# Patient Record
Sex: Female | Born: 1955 | Race: White | Hispanic: No | Smoking: Never smoker
Health system: Southern US, Community
[De-identification: ages and names within clinical notes are randomized; demographics above are authoritative.]

## PROBLEM LIST (undated history)

## (undated) DIAGNOSIS — E7800 Pure hypercholesterolemia, unspecified: Secondary | ICD-10-CM

## (undated) DIAGNOSIS — E039 Hypothyroidism, unspecified: Secondary | ICD-10-CM

## (undated) DIAGNOSIS — D649 Anemia, unspecified: Secondary | ICD-10-CM

## (undated) DIAGNOSIS — Z9289 Personal history of other medical treatment: Secondary | ICD-10-CM

## (undated) HISTORY — PX: COLONOSCOPY: SHX174

## (undated) HISTORY — PX: ABDOMINAL HYSTERECTOMY: SHX81

---

## 2018-10-06 ENCOUNTER — Other Ambulatory Visit: Payer: Self-pay | Admitting: Specialist

## 2018-10-06 DIAGNOSIS — N6320 Unspecified lump in the left breast, unspecified quadrant: Secondary | ICD-10-CM

## 2018-10-10 ENCOUNTER — Other Ambulatory Visit: Payer: Self-pay | Admitting: Specialist

## 2018-10-10 DIAGNOSIS — N6320 Unspecified lump in the left breast, unspecified quadrant: Secondary | ICD-10-CM

## 2018-10-17 ENCOUNTER — Ambulatory Visit
Admission: RE | Admit: 2018-10-17 | Discharge: 2018-10-17 | Disposition: A | Discharge status: Home / Self Care | Payer: BC Managed Care – PPO | Source: Ambulatory Visit | Attending: Specialist | Admitting: Specialist

## 2018-10-17 ENCOUNTER — Other Ambulatory Visit: Payer: Self-pay

## 2018-10-17 ENCOUNTER — Other Ambulatory Visit: Payer: Self-pay | Admitting: Specialist

## 2018-10-17 DIAGNOSIS — N6320 Unspecified lump in the left breast, unspecified quadrant: Secondary | ICD-10-CM

## 2018-10-17 DIAGNOSIS — R2232 Localized swelling, mass and lump, left upper limb: Secondary | ICD-10-CM

## 2018-10-21 ENCOUNTER — Ambulatory Visit
Admission: RE | Admit: 2018-10-21 | Discharge: 2018-10-21 | Disposition: A | Discharge status: Home / Self Care | Payer: BC Managed Care – PPO | Source: Ambulatory Visit | Attending: Specialist | Admitting: Specialist

## 2018-10-21 ENCOUNTER — Other Ambulatory Visit: Payer: Self-pay

## 2018-10-21 DIAGNOSIS — N6320 Unspecified lump in the left breast, unspecified quadrant: Secondary | ICD-10-CM

## 2018-11-02 ENCOUNTER — Ambulatory Visit: Payer: Self-pay | Admitting: General Surgery

## 2018-11-02 DIAGNOSIS — D242 Benign neoplasm of left breast: Secondary | ICD-10-CM

## 2018-11-04 ENCOUNTER — Other Ambulatory Visit: Payer: Self-pay | Admitting: General Surgery

## 2018-11-04 DIAGNOSIS — D242 Benign neoplasm of left breast: Secondary | ICD-10-CM

## 2018-11-10 ENCOUNTER — Other Ambulatory Visit: Payer: Self-pay

## 2018-11-10 ENCOUNTER — Encounter (HOSPITAL_BASED_OUTPATIENT_CLINIC_OR_DEPARTMENT_OTHER): Payer: Self-pay | Admitting: *Deleted

## 2018-11-14 ENCOUNTER — Other Ambulatory Visit (HOSPITAL_COMMUNITY): Payer: BC Managed Care – PPO

## 2018-11-14 ENCOUNTER — Other Ambulatory Visit (HOSPITAL_COMMUNITY)
Admission: RE | Admit: 2018-11-14 | Discharge: 2018-11-14 | Disposition: A | Discharge status: Home / Self Care | Payer: BC Managed Care – PPO | Source: Ambulatory Visit | Attending: General Surgery | Admitting: General Surgery

## 2018-11-14 DIAGNOSIS — Z20828 Contact with and (suspected) exposure to other viral communicable diseases: Secondary | ICD-10-CM | POA: Diagnosis not present

## 2018-11-14 DIAGNOSIS — Z01812 Encounter for preprocedural laboratory examination: Secondary | ICD-10-CM | POA: Insufficient documentation

## 2018-11-14 LAB — SARS CORONAVIRUS 2 (TAT 6-24 HRS): SARS Coronavirus 2: NEGATIVE

## 2018-11-16 ENCOUNTER — Ambulatory Visit
Admission: RE | Admit: 2018-11-16 | Discharge: 2018-11-16 | Disposition: A | Discharge status: Home / Self Care | Payer: BC Managed Care – PPO | Source: Ambulatory Visit | Attending: General Surgery | Admitting: General Surgery

## 2018-11-16 ENCOUNTER — Other Ambulatory Visit: Payer: Self-pay

## 2018-11-16 DIAGNOSIS — D242 Benign neoplasm of left breast: Secondary | ICD-10-CM

## 2018-11-16 NOTE — Progress Notes (Signed)
Pt arrived to pick up her ensure presurgery drink. Instructed to finish DOS by 0630. Pt verbalized understanding.

## 2018-11-17 ENCOUNTER — Encounter (HOSPITAL_BASED_OUTPATIENT_CLINIC_OR_DEPARTMENT_OTHER)
Admission: RE | Disposition: A | Discharge status: Home / Self Care | Payer: Self-pay | Source: Home / Self Care | Attending: General Surgery

## 2018-11-17 ENCOUNTER — Ambulatory Visit (HOSPITAL_BASED_OUTPATIENT_CLINIC_OR_DEPARTMENT_OTHER)
Admission: RE | Admit: 2018-11-17 | Discharge: 2018-11-17 | Disposition: A | Discharge status: Home / Self Care | Payer: BC Managed Care – PPO | Attending: General Surgery | Admitting: General Surgery

## 2018-11-17 ENCOUNTER — Other Ambulatory Visit: Payer: Self-pay

## 2018-11-17 ENCOUNTER — Ambulatory Visit (HOSPITAL_BASED_OUTPATIENT_CLINIC_OR_DEPARTMENT_OTHER): Payer: BC Managed Care – PPO | Admitting: Anesthesiology

## 2018-11-17 ENCOUNTER — Ambulatory Visit
Admission: RE | Admit: 2018-11-17 | Discharge: 2018-11-17 | Disposition: A | Discharge status: Home / Self Care | Payer: BC Managed Care – PPO | Source: Ambulatory Visit | Attending: General Surgery | Admitting: General Surgery

## 2018-11-17 ENCOUNTER — Encounter (HOSPITAL_BASED_OUTPATIENT_CLINIC_OR_DEPARTMENT_OTHER): Payer: Self-pay | Admitting: *Deleted

## 2018-11-17 DIAGNOSIS — Z8541 Personal history of malignant neoplasm of cervix uteri: Secondary | ICD-10-CM | POA: Diagnosis not present

## 2018-11-17 DIAGNOSIS — Z820 Family history of epilepsy and other diseases of the nervous system: Secondary | ICD-10-CM | POA: Diagnosis not present

## 2018-11-17 DIAGNOSIS — Z8261 Family history of arthritis: Secondary | ICD-10-CM | POA: Insufficient documentation

## 2018-11-17 DIAGNOSIS — K649 Unspecified hemorrhoids: Secondary | ICD-10-CM | POA: Insufficient documentation

## 2018-11-17 DIAGNOSIS — Z90710 Acquired absence of both cervix and uterus: Secondary | ICD-10-CM | POA: Diagnosis not present

## 2018-11-17 DIAGNOSIS — Z803 Family history of malignant neoplasm of breast: Secondary | ICD-10-CM | POA: Diagnosis not present

## 2018-11-17 DIAGNOSIS — D242 Benign neoplasm of left breast: Secondary | ICD-10-CM | POA: Diagnosis not present

## 2018-11-17 DIAGNOSIS — E039 Hypothyroidism, unspecified: Secondary | ICD-10-CM | POA: Diagnosis not present

## 2018-11-17 DIAGNOSIS — Z8249 Family history of ischemic heart disease and other diseases of the circulatory system: Secondary | ICD-10-CM | POA: Insufficient documentation

## 2018-11-17 DIAGNOSIS — Z79899 Other long term (current) drug therapy: Secondary | ICD-10-CM | POA: Insufficient documentation

## 2018-11-17 HISTORY — DX: Hypothyroidism, unspecified: E03.9

## 2018-11-17 HISTORY — DX: Anemia, unspecified: D64.9

## 2018-11-17 HISTORY — DX: Pure hypercholesterolemia, unspecified: E78.00

## 2018-11-17 HISTORY — PX: BREAST LUMPECTOMY WITH RADIOACTIVE SEED LOCALIZATION: SHX6424

## 2018-11-17 SURGERY — BREAST LUMPECTOMY WITH RADIOACTIVE SEED LOCALIZATION
Anesthesia: General | Site: Breast | Laterality: Left

## 2018-11-17 MED ORDER — MEPERIDINE HCL 25 MG/ML IJ SOLN: 6.2500 mg | INTRAMUSCULAR | Status: DC | PRN

## 2018-11-17 MED ORDER — CELECOXIB 200 MG PO CAPS
ORAL_CAPSULE | ORAL | Status: AC
  Filled 2018-11-17: qty 1

## 2018-11-17 MED ORDER — ACETAMINOPHEN 500 MG PO TABS
1000.0000 mg | ORAL_TABLET | ORAL | Status: AC
  Administered 2018-11-17: 1000 mg via ORAL

## 2018-11-17 MED ORDER — PROPOFOL 10 MG/ML IV BOLUS
INTRAVENOUS | Status: AC
  Filled 2018-11-17: qty 20

## 2018-11-17 MED ORDER — HYDROCODONE-ACETAMINOPHEN 5-325 MG PO TABS: 1.0000 | ORAL_TABLET | Freq: Four times a day (QID) | ORAL | 0 refills | Status: DC | PRN

## 2018-11-17 MED ORDER — FENTANYL CITRATE (PF) 100 MCG/2ML IJ SOLN: 25.0000 ug | INTRAMUSCULAR | Status: DC | PRN

## 2018-11-17 MED ORDER — ACETAMINOPHEN 500 MG PO TABS
ORAL_TABLET | ORAL | Status: AC
  Filled 2018-11-17: qty 2

## 2018-11-17 MED ORDER — CEFAZOLIN SODIUM-DEXTROSE 2-4 GM/100ML-% IV SOLN
2.0000 g | INTRAVENOUS | Status: AC
  Administered 2018-11-17: 2 g via INTRAVENOUS

## 2018-11-17 MED ORDER — CEFAZOLIN SODIUM-DEXTROSE 2-4 GM/100ML-% IV SOLN
INTRAVENOUS | Status: AC
  Filled 2018-11-17: qty 100

## 2018-11-17 MED ORDER — PROPOFOL 10 MG/ML IV BOLUS
INTRAVENOUS | Status: DC | PRN
  Administered 2018-11-17: 150 mg via INTRAVENOUS

## 2018-11-17 MED ORDER — BUPIVACAINE-EPINEPHRINE (PF) 0.25% -1:200000 IJ SOLN
INTRAMUSCULAR | Status: DC | PRN
  Administered 2018-11-17: 20 mL

## 2018-11-17 MED ORDER — MIDAZOLAM HCL 2 MG/2ML IJ SOLN
INTRAMUSCULAR | Status: AC
  Filled 2018-11-17: qty 2

## 2018-11-17 MED ORDER — DEXAMETHASONE SODIUM PHOSPHATE 10 MG/ML IJ SOLN
INTRAMUSCULAR | Status: AC
  Filled 2018-11-17: qty 1

## 2018-11-17 MED ORDER — FENTANYL CITRATE (PF) 100 MCG/2ML IJ SOLN
INTRAMUSCULAR | Status: AC
  Filled 2018-11-17: qty 2

## 2018-11-17 MED ORDER — CHLORHEXIDINE GLUCONATE CLOTH 2 % EX PADS: 6.0000 | MEDICATED_PAD | Freq: Once | CUTANEOUS | Status: DC

## 2018-11-17 MED ORDER — CELECOXIB 200 MG PO CAPS
200.0000 mg | ORAL_CAPSULE | ORAL | Status: AC
  Administered 2018-11-17: 200 mg via ORAL

## 2018-11-17 MED ORDER — LIDOCAINE 2% (20 MG/ML) 5 ML SYRINGE
INTRAMUSCULAR | Status: AC
  Filled 2018-11-17: qty 5

## 2018-11-17 MED ORDER — LACTATED RINGERS IV SOLN
INTRAVENOUS | Status: DC
  Administered 2018-11-17: 09:00:00 via INTRAVENOUS

## 2018-11-17 MED ORDER — ONDANSETRON HCL 4 MG/2ML IJ SOLN
INTRAMUSCULAR | Status: AC
  Filled 2018-11-17: qty 2

## 2018-11-17 MED ORDER — EPHEDRINE SULFATE 50 MG/ML IJ SOLN
INTRAMUSCULAR | Status: DC | PRN
  Administered 2018-11-17: 10 mg via INTRAVENOUS

## 2018-11-17 MED ORDER — ONDANSETRON HCL 4 MG/2ML IJ SOLN
INTRAMUSCULAR | Status: DC | PRN
  Administered 2018-11-17: 4 mg via INTRAVENOUS

## 2018-11-17 MED ORDER — LIDOCAINE 2% (20 MG/ML) 5 ML SYRINGE
INTRAMUSCULAR | Status: DC | PRN
  Administered 2018-11-17: 100 mg via INTRAVENOUS

## 2018-11-17 MED ORDER — DEXAMETHASONE SODIUM PHOSPHATE 4 MG/ML IJ SOLN
INTRAMUSCULAR | Status: DC | PRN
  Administered 2018-11-17: 5 mg via INTRAVENOUS

## 2018-11-17 MED ORDER — METOCLOPRAMIDE HCL 5 MG/ML IJ SOLN: 10.0000 mg | Freq: Once | INTRAMUSCULAR | Status: DC | PRN

## 2018-11-17 MED ORDER — MIDAZOLAM HCL 2 MG/2ML IJ SOLN
1.0000 mg | INTRAMUSCULAR | Status: DC | PRN
  Administered 2018-11-17: 2 mg via INTRAVENOUS

## 2018-11-17 MED ORDER — GABAPENTIN 300 MG PO CAPS
ORAL_CAPSULE | ORAL | Status: AC
  Filled 2018-11-17: qty 1

## 2018-11-17 MED ORDER — GABAPENTIN 300 MG PO CAPS
300.0000 mg | ORAL_CAPSULE | ORAL | Status: AC
  Administered 2018-11-17: 09:00:00 300 mg via ORAL

## 2018-11-17 MED ORDER — FENTANYL CITRATE (PF) 100 MCG/2ML IJ SOLN
50.0000 ug | INTRAMUSCULAR | Status: AC | PRN
  Administered 2018-11-17 (×2): 25 ug via INTRAVENOUS
  Administered 2018-11-17: 50 ug via INTRAVENOUS

## 2018-11-17 MED ORDER — LACTATED RINGERS IV SOLN: INTRAVENOUS | Status: DC

## 2018-11-17 SURGICAL SUPPLY — 45 items
APPLIER CLIP 9.375 MED OPEN (MISCELLANEOUS) ×3
BLADE SURG 15 STRL LF DISP TIS (BLADE) ×1 IMPLANT
BLADE SURG 15 STRL SS (BLADE) ×2
CANISTER SUC SOCK COL 7IN (MISCELLANEOUS) IMPLANT
CANISTER SUCT 1200ML W/VALVE (MISCELLANEOUS) IMPLANT
CHLORAPREP W/TINT 26 (MISCELLANEOUS) ×3 IMPLANT
CLIP APPLIE 9.375 MED OPEN (MISCELLANEOUS) ×1 IMPLANT
COVER BACK TABLE REUSABLE LG (DRAPES) ×3 IMPLANT
COVER MAYO STAND REUSABLE (DRAPES) ×3 IMPLANT
COVER PROBE W GEL 5X96 (DRAPES) ×3 IMPLANT
COVER WAND RF STERILE (DRAPES) IMPLANT
DECANTER SPIKE VIAL GLASS SM (MISCELLANEOUS) IMPLANT
DERMABOND ADVANCED (GAUZE/BANDAGES/DRESSINGS) ×2
DERMABOND ADVANCED .7 DNX12 (GAUZE/BANDAGES/DRESSINGS) ×1 IMPLANT
DRAPE LAPAROSCOPIC ABDOMINAL (DRAPES) ×3 IMPLANT
DRAPE UTILITY XL STRL (DRAPES) ×3 IMPLANT
ELECT COATED BLADE 2.86 ST (ELECTRODE) ×3 IMPLANT
ELECT REM PT RETURN 9FT ADLT (ELECTROSURGICAL) ×3
ELECTRODE REM PT RTRN 9FT ADLT (ELECTROSURGICAL) ×1 IMPLANT
GLOVE BIO SURGEON STRL SZ7.5 (GLOVE) ×6 IMPLANT
GLOVE BIOGEL PI IND STRL 7.0 (GLOVE) ×1 IMPLANT
GLOVE BIOGEL PI IND STRL 7.5 (GLOVE) ×1 IMPLANT
GLOVE BIOGEL PI INDICATOR 7.0 (GLOVE) ×2
GLOVE BIOGEL PI INDICATOR 7.5 (GLOVE) ×2
GLOVE ECLIPSE 7.0 STRL STRAW (GLOVE) ×3 IMPLANT
GOWN STRL REUS W/ TWL LRG LVL3 (GOWN DISPOSABLE) ×2 IMPLANT
GOWN STRL REUS W/TWL LRG LVL3 (GOWN DISPOSABLE) ×4
ILLUMINATOR WAVEGUIDE N/F (MISCELLANEOUS) IMPLANT
KIT MARKER MARGIN INK (KITS) ×3 IMPLANT
LIGHT WAVEGUIDE WIDE FLAT (MISCELLANEOUS) IMPLANT
NEEDLE HYPO 25X1 1.5 SAFETY (NEEDLE) ×3 IMPLANT
NS IRRIG 1000ML POUR BTL (IV SOLUTION) ×3 IMPLANT
PACK BASIN DAY SURGERY FS (CUSTOM PROCEDURE TRAY) ×3 IMPLANT
PENCIL BUTTON HOLSTER BLD 10FT (ELECTRODE) ×3 IMPLANT
SLEEVE SCD COMPRESS KNEE MED (MISCELLANEOUS) ×3 IMPLANT
SPONGE LAP 18X18 RF (DISPOSABLE) ×3 IMPLANT
SUT MON AB 4-0 PC3 18 (SUTURE) ×3 IMPLANT
SUT SILK 2 0 SH (SUTURE) IMPLANT
SUT VICRYL 3-0 CR8 SH (SUTURE) ×3 IMPLANT
SYR CONTROL 10ML LL (SYRINGE) ×3 IMPLANT
TOWEL GREEN STERILE FF (TOWEL DISPOSABLE) ×3 IMPLANT
TRAY FAXITRON CT DISP (TRAY / TRAY PROCEDURE) ×3 IMPLANT
TUBE CONNECTING 20'X1/4 (TUBING)
TUBE CONNECTING 20X1/4 (TUBING) IMPLANT
YANKAUER SUCT BULB TIP NO VENT (SUCTIONS) IMPLANT

## 2018-11-17 NOTE — Transfer of Care (Signed)
Immediate Anesthesia Transfer of Care Note  Patient: Mariah Murray  Procedure(s) Performed: LEFT BREAST LUMPECTOMY WITH RADIOACTIVE SEED LOCALIZATION (Left Breast)  Patient Location: PACU  Anesthesia Type:General  Level of Consciousness: sedated  Airway & Oxygen Therapy: Patient Spontanous Breathing and Patient connected to face mask oxygen  Post-op Assessment: Report given to RN and Post -op Vital signs reviewed and stable  Post vital signs: Reviewed and stable  Last Vitals:  Vitals Value Taken Time  BP 101/61 11/17/18 1130  Temp    Pulse 83 11/17/18 1131  Resp 15 11/17/18 1131  SpO2 100 % 11/17/18 1131  Vitals shown include unvalidated device data.  Last Pain:  Vitals:   11/17/18 0857  TempSrc: Oral  PainSc: 0-No pain      Patients Stated Pain Goal: 0 ([id number])  Complications: No apparent anesthesia complications

## 2018-11-17 NOTE — H&P (Signed)
Mariah Murray  Location: [facility] Patient #: [id number] DOB: 1956-02-03 Single / Language: Cleophus Molt / Race: White Female   History of Present Illness  The patient is a 63 year old female who presents with a breast mass. We are asked to see the patient in consultation by Dr. Jamey Reas to evaluate her for an intraductal papilloma of the left breast. The patient is a 63 year old white female who recently went for a routine screening mammogram. At that time she was found to have 3 abnormalities in the outer aspect of the left breast. All 3 were biopsied. 2 came back as fibrocystic disease with calcification and the final one came back as an intraductal papilloma. She denies any breast pain or discharge from the nipple. She does have a family history significant for breast cancer in her sister at the age of 20. She is otherwise in good health and has no complaints today.   Past Surgical History  Breast Biopsy  Left. 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 Atorvastatin Calcium (10MG  Tablet, Oral) Active. Iron (325 (65 Fe)MG Tablet, Oral) Active. Biotin (400MCG Tablet, Oral) Active. Vitamin D3 (50 MCG(2000 UT) Capsule, Oral) Active. Medications Reconciled  Social History  Caffeine use  Carbonated beverages, Coffee, Tea. No alcohol use  No drug use  Tobacco use  Never smoker.  Family History  Arthritis  Mother. Breast Cancer  Sister. Cancer  Father. Heart Disease  Mother. Heart disease in female family member before age 7  Hypertension  Mother. Migraine Headache  Sister.  Pregnancy / Birth History  Age at menarche  82 years. Age of menopause  <45 Gravida  0  Other Problems  Cervical Cancer  Hemorrhoids      Review of Systems  General Not Present- Appetite Loss, Chills, Fatigue, Fever, Night Sweats, Weight Gain and Weight Loss. Skin Not Present- Change in Wart/Mole, Dryness, Hives, Jaundice, New Lesions, Non-Healing Wounds, Rash and Ulcer. HEENT Present- Wears glasses/contact lenses. Not Present- Earache, Hearing Loss, Hoarseness, Nose Bleed, Oral Ulcers, Ringing in the Ears, Seasonal Allergies, Sinus Pain, Sore Throat, Visual Disturbances and Yellow Eyes. Respiratory Not Present- Bloody sputum, Chronic Cough, Difficulty Breathing, Snoring and Wheezing. Breast Present- Breast Mass. Not Present- Breast Pain, Nipple Discharge and Skin Changes. Cardiovascular Not Present- Chest Pain, Difficulty Breathing Lying Down, Leg Cramps, Palpitations, Rapid Heart Rate, Shortness of Breath and Swelling of Extremities. Gastrointestinal Present- Constipation and Hemorrhoids. Not Present- Abdominal Pain, Bloating, Bloody Stool, Change in Bowel Habits, Chronic diarrhea, Difficulty Swallowing, Excessive gas, Gets full quickly at meals, Indigestion, Nausea, Rectal Pain and Vomiting. Female Genitourinary Not Present- Frequency, Nocturia, Painful Urination, Pelvic Pain and Urgency. Musculoskeletal Not Present- Back Pain, Joint Pain, Joint Stiffness, Muscle Pain, Muscle Weakness and Swelling of Extremities. Neurological Not Present- Decreased Memory, Fainting, Headaches, Numbness, Seizures, Tingling, Tremor, Trouble walking and Weakness. Psychiatric Not Present- Anxiety, Bipolar, Change in Sleep Pattern, Depression, Fearful and Frequent crying. Endocrine Present- Hot flashes. Not Present- Cold Intolerance, Excessive Hunger, Hair Changes, Heat Intolerance and New Diabetes. Hematology Not Present- Blood Thinners, Easy Bruising, Excessive bleeding, Gland problems, HIV and Persistent Infections.  Vitals Weight: 166.4 lb Height: 63in Body Surface Area: 1.79 m Body Mass Index: 29.48 kg/m  Temp.: 81F (Temporal)   Pulse: 96 (Regular)  BP: 164/92(Sitting, Left Arm,  Standard)       Physical Exam  General Mental Status-Alert. General Appearance-Consistent with stated age. Hydration-Well hydrated. Voice-Normal.  Head and Neck Head-normocephalic, atraumatic with no lesions or palpable masses. Trachea-midline. Thyroid Gland Characteristics - normal size and consistency.  Eye Eyeball - Bilateral-Extraocular movements intact. Sclera/Conjunctiva - Bilateral-No scleral icterus.  Chest and Lung Exam Chest and lung exam reveals -quiet, even and easy respiratory effort with no use of accessory muscles and on auscultation, normal breath sounds, no adventitious sounds and normal vocal resonance. Inspection Chest Wall - Normal. Back - normal.  Breast Note: There is no palpable mass in either breast. There is no palpable axillary, supraclavicular, or cervical lymphadenopathy.   Cardiovascular Cardiovascular examination reveals -normal heart sounds, regular rate and rhythm with no murmurs and normal pedal pulses bilaterally.  Abdomen Inspection Inspection of the abdomen reveals - No Hernias. Skin - Scar - no surgical scars. Palpation/Percussion Palpation and Percussion of the abdomen reveal - Soft, Non Tender, No Rebound tenderness, No Rigidity (guarding) and No hepatosplenomegaly. Auscultation Auscultation of the abdomen reveals - Bowel sounds normal.  Neurologic Neurologic evaluation reveals -alert and oriented x 3 with no impairment of recent or remote memory. Mental Status-Normal.  Musculoskeletal Normal Exam - Left-Upper Extremity Strength Normal and Lower Extremity Strength Normal. Normal Exam - Right-Upper Extremity Strength Normal and Lower Extremity Strength Normal.  Lymphatic Head & Neck  General Head & Neck Lymphatics: Bilateral - Description - Normal. Axillary  General Axillary Region: Bilateral - Description - Normal. Tenderness - Non  Tender. Femoral & Inguinal  Generalized Femoral & Inguinal Lymphatics: Bilateral - Description - Normal. Tenderness - Non Tender.    Assessment & Plan   INTRADUCTAL PAPILLOMA OF BREAST, LEFT (D24.2) Impression: The patient appears to have a small intraductal papilloma in the outer aspect of the left breast. Because there is about a 5-10% chance that there could be something more significant in this location of the breast and because of her family history of breast cancer at it would be reasonable to remove this area. I have discussed with her in detail the risks and benefits of the operation as well as some of the technical aspects including the use of a radioactive seed and she understands and her sister proceed.

## 2018-11-17 NOTE — Op Note (Signed)
11/17/2018  11:32 AM  PATIENT:  Mariah Murray  64 y.o. female  PRE-OPERATIVE DIAGNOSIS:  LEFT BREAST PAPILLOMA  POST-OPERATIVE DIAGNOSIS:  lEFT BREAST PAPILLOMA  PROCEDURE:  Procedure(s): LEFT BREAST LUMPECTOMY WITH RADIOACTIVE SEED LOCALIZATION (Left)  SURGEON:  Surgeon(s) and Role:    Jovita Kussmaul, MD - Primary  PHYSICIAN ASSISTANT:   ASSISTANTS: none   ANESTHESIA:   local and general  EBL:  5 mL   BLOOD ADMINISTERED:none  DRAINS: none   LOCAL MEDICATIONS USED:  MARCAINE     SPECIMEN:  Source of Specimen:  LEFT BREAST TISSUE  DISPOSITION OF SPECIMEN:  PATHOLOGY  COUNTS:  YES  TOURNIQUET:  * No tourniquets in log *  DICTATION: .Dragon Dictation   After informed consent was obtained the patient was brought to the operating room and placed in the supine position on the operating table.  After adequate induction of general anesthesia the patient's left breast was prepped with ChloraPrep, allowed to dry, and draped in usual sterile manner.  An appropriate timeout was performed.  Previously an I-125 seed was placed in the lower outer aspect of the left breast to mark an area of a papilloma.  The neoprobe was set to I-125 in the area of radioactivity was readily identified.  The area around this was infiltrated with quarter percent Marcaine.  A curvilinear incision was made along the lower outer aspect of the breast tissue with a 15 blade knife.  The incision was carried through the skin and subcutaneous tissue sharply with the electrocautery.  Dissection was then carried out sharply with the electrocautery towards the radioactive seed under the direction of the neoprobe.  Once I more closely approached the radioactive seed I then removed a circular portion of breast tissue sharply with the electrocautery around the radioactive seed while checking the area of radioactivity frequently.  Once the specimen was removed it was oriented with the appropriate paint colors.  A specimen  radiograph was obtained that showed the clip and seed to be near the center of the specimen.  The specimen was then sent to pathology for further evaluation.  Hemostasis was achieved using the Bovie electrocautery.  The deep layer of the wound was then closed with layers of interrupted 3-0 Vicryl stitches.  The skin was closed with a running 4-0 Monocryl subcuticular stitch.  Dermabond dressings were applied.  The patient tolerated the procedure well.  At the end of the case all needle sponge and instrument counts were correct.  The patient was then awakened and taken to recovery in stable condition.  PLAN OF CARE: Discharge to home after PACU  PATIENT DISPOSITION:  PACU - hemodynamically stable.   Delay start of Pharmacological VTE agent (>24hrs) due to surgical blood loss or risk of bleeding: not applicable

## 2018-11-17 NOTE — Anesthesia Procedure Notes (Signed)
Procedure Name: LMA Insertion Date/Time: 11/17/2018 10:46 AM Performed by: Maryella Shivers, CRNA Pre-anesthesia Checklist: Patient identified, Emergency Drugs available, Suction available and Patient being monitored Patient Re-evaluated:Patient Re-evaluated prior to induction Oxygen Delivery Method: Circle system utilized Preoxygenation: Pre-oxygenation with 100% oxygen Induction Type: IV induction Ventilation: Mask ventilation without difficulty LMA: LMA inserted LMA Size: 4.0 Number of attempts: 1 Airway Equipment and Method: Bite block Placement Confirmation: positive ETCO2 Tube secured with: Tape Dental Injury: Teeth and Oropharynx as per pre-operative assessment

## 2018-11-17 NOTE — Discharge Instructions (Signed)
May take next dose of Tylenol at 2:00 PM on 11/17/2018 if needed. May take next dose of Ibuprofen at 4:00 PM on 11/17/2018 if needed.    Post Anesthesia Home Care Instructions  Activity: Get plenty of rest for the remainder of the day. A responsible individual must stay with you for 24 hours following the procedure.  For the next 24 hours, DO NOT: -Drive a car -Paediatric nurse -Drink alcoholic beverages -Take any medication unless instructed by your physician -Make any legal decisions or sign important papers.  Meals: Start with liquid foods such as gelatin or soup. Progress to regular foods as tolerated. Avoid greasy, spicy, heavy foods. If nausea and/or vomiting occur, drink only clear liquids until the nausea and/or vomiting subsides. Call your physician if vomiting continues.  Special Instructions/Symptoms: Your throat may feel dry or sore from the anesthesia or the breathing tube placed in your throat during surgery. If this causes discomfort, gargle with warm salt water. The discomfort should disappear within 24 hours.  If you had a scopolamine patch placed behind your ear for the management of post- operative nausea and/or vomiting:  1. The medication in the patch is effective for 72 hours, after which it should be removed.  Wrap patch in a tissue and discard in the trash. Wash hands thoroughly with soap and water. 2. You may remove the patch earlier than 72 hours if you experience unpleasant side effects which may include dry mouth, dizziness or visual disturbances. 3. Avoid touching the patch. Wash your hands with soap and water after contact with the patch.

## 2018-11-17 NOTE — Interval H&P Note (Signed)
History and Physical Interval Note:  11/17/2018 9:55 AM  Mariah Murray  has presented today for surgery, with the diagnosis of LEFT BREAST PAPILLOMA.  The various methods of treatment have been discussed with the patient and family. After consideration of risks, benefits and other options for treatment, the patient has consented to  Procedure(s): LEFT BREAST LUMPECTOMY WITH RADIOACTIVE SEED LOCALIZATION (Left) as a surgical intervention.  The patient's history has been reviewed, patient examined, no change in status, stable for surgery.  I have reviewed the patient's chart and labs.  Questions were answered to the patient's satisfaction.     Autumn Messing III

## 2018-11-17 NOTE — Anesthesia Preprocedure Evaluation (Signed)
Anesthesia Evaluation  Patient identified by MRN, date of birth, ID band Patient awake    Reviewed: Allergy & Precautions, NPO status , Patient's Chart, lab work & pertinent test results  Airway Mallampati: II  TM Distance: >3 FB Neck ROM: Full    Dental no notable dental hx.    Pulmonary neg pulmonary ROS,    Pulmonary exam normal breath sounds clear to auscultation       Cardiovascular negative cardio ROS Normal cardiovascular exam Rhythm:Regular Rate:Normal     Neuro/Psych negative neurological ROS  negative psych ROS   GI/Hepatic negative GI ROS, Neg liver ROS,   Endo/Other  Hypothyroidism   Renal/GU negative Renal ROS  negative genitourinary   Musculoskeletal negative musculoskeletal ROS (+)   Abdominal   Peds negative pediatric ROS (+)  Hematology negative hematology ROS (+)   Anesthesia Other Findings   Reproductive/Obstetrics negative OB ROS                             Anesthesia Physical Anesthesia Plan  ASA: II  Anesthesia Plan: General   Post-op Pain Management:    Induction: Intravenous  PONV Risk Score and Plan: 3 and Ondansetron, Dexamethasone, Midazolam and Treatment may vary due to age or medical condition  Airway Management Planned: LMA  Additional Equipment:   Intra-op Plan:   Post-operative Plan:   Informed Consent: I have reviewed the patients History and Physical, chart, labs and discussed the procedure including the risks, benefits and alternatives for the proposed anesthesia with the patient or authorized representative who has indicated his/her understanding and acceptance.     Dental advisory given  Plan Discussed with: CRNA  Anesthesia Plan Comments:         Anesthesia Quick Evaluation

## 2018-11-18 ENCOUNTER — Encounter (HOSPITAL_BASED_OUTPATIENT_CLINIC_OR_DEPARTMENT_OTHER): Payer: Self-pay | Admitting: General Surgery

## 2018-11-18 NOTE — Anesthesia Postprocedure Evaluation (Signed)
Anesthesia Post Note  Patient: Mariah Murray  Procedure(s) Performed: LEFT BREAST LUMPECTOMY WITH RADIOACTIVE SEED LOCALIZATION (Left Breast)     Patient location during evaluation: PACU Anesthesia Type: General Level of consciousness: awake and alert Pain management: pain level controlled Vital Signs Assessment: post-procedure vital signs reviewed and stable Respiratory status: spontaneous breathing, nonlabored ventilation, respiratory function stable and patient connected to nasal cannula oxygen Cardiovascular status: blood pressure returned to baseline and stable Postop Assessment: no apparent nausea or vomiting Anesthetic complications: no    Last Vitals:  Vitals:   11/17/18 1215 11/17/18 1230  BP: 114/64 122/69  Pulse: 68 62  Resp: 15 16  Temp:  36.6 C  SpO2: 98% 97%    Last Pain:  Vitals:   11/17/18 0857  TempSrc: Oral   Pain Goal: Patients Stated Pain Goal: 3 (11/17/18 1215)                 Montez Hageman

## 2018-11-21 LAB — SURGICAL PATHOLOGY

## 2019-06-26 ENCOUNTER — Encounter (INDEPENDENT_AMBULATORY_CARE_PROVIDER_SITE_OTHER): Payer: BC Managed Care – PPO | Admitting: Ophthalmology

## 2019-06-26 DIAGNOSIS — H35371 Puckering of macula, right eye: Secondary | ICD-10-CM | POA: Diagnosis not present

## 2019-06-26 DIAGNOSIS — H43813 Vitreous degeneration, bilateral: Secondary | ICD-10-CM

## 2019-06-27 DIAGNOSIS — H35371 Puckering of macula, right eye: Secondary | ICD-10-CM | POA: Diagnosis present

## 2019-06-27 NOTE — H&P (Signed)
Mariah Murray is an 64 y.o. female.   Chief Complaint: loss of vision right eye HPI: Noted progressive and painless vision loss for 2 years right eye  Past Medical History:  Diagnosis Date  . Anemia   . High cholesterol   . Hypothyroidism     Past Surgical History:  Procedure Laterality Date  . ABDOMINAL HYSTERECTOMY    . BREAST LUMPECTOMY WITH RADIOACTIVE SEED LOCALIZATION Left 11/17/2018   Procedure: LEFT BREAST LUMPECTOMY WITH RADIOACTIVE SEED LOCALIZATION;  Surgeon: Jovita Kussmaul, MD;  Location: [facility];  Service: General;  Laterality: Left;    No family history on file. Social History:  reports that she has never smoked. She has never used smokeless tobacco. She reports that she does not drink alcohol or use drugs.  Allergies: No Known Allergies  No medications prior to admission.    Review of systems otherwise negative  There were no vitals taken for this visit.  Physical exam: Mental status: oriented x3. Eyes: See eye exam associated with this date of surgery in media tab.  Scanned in by scanning center Ears, Nose, Throat: within normal limits Neck: Within Normal limits General: within normal limits Chest: Within normal limits Breast: deferred Heart: Within normal limits Abdomen: Within normal limits GU: deferred Extremities: within normal limits Skin: within normal limits  Assessment/Plan Macular pucker, right eye.  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

## 2019-07-04 ENCOUNTER — Other Ambulatory Visit: Payer: Self-pay

## 2019-07-04 ENCOUNTER — Encounter (INDEPENDENT_AMBULATORY_CARE_PROVIDER_SITE_OTHER): Payer: BC Managed Care – PPO | Admitting: Ophthalmology

## 2019-07-14 ENCOUNTER — Other Ambulatory Visit (HOSPITAL_COMMUNITY)
Admission: RE | Admit: 2019-07-14 | Discharge: 2019-07-14 | Disposition: A | Discharge status: Home / Self Care | Payer: BC Managed Care – PPO | Source: Ambulatory Visit | Attending: Ophthalmology | Admitting: Ophthalmology

## 2019-07-14 DIAGNOSIS — Z01812 Encounter for preprocedural laboratory examination: Secondary | ICD-10-CM | POA: Diagnosis not present

## 2019-07-14 DIAGNOSIS — Z20822 Contact with and (suspected) exposure to covid-19: Secondary | ICD-10-CM | POA: Diagnosis not present

## 2019-07-14 LAB — SARS CORONAVIRUS 2 (TAT 6-24 HRS): SARS Coronavirus 2: NEGATIVE

## 2019-07-17 ENCOUNTER — Other Ambulatory Visit: Payer: Self-pay

## 2019-07-17 ENCOUNTER — Encounter (HOSPITAL_COMMUNITY): Payer: Self-pay | Admitting: Ophthalmology

## 2019-07-17 NOTE — Anesthesia Preprocedure Evaluation (Addendum)
Anesthesia Evaluation  Patient identified by MRN, date of birth, ID band Patient awake    Reviewed: Allergy & Precautions, NPO status , Patient's Chart, lab work & pertinent test results  History of Anesthesia Complications Negative for: history of anesthetic complications  Airway Mallampati: II  TM Distance: >3 FB Neck ROM: Full    Dental no notable dental hx.    Pulmonary neg pulmonary ROS,    Pulmonary exam normal        Cardiovascular negative cardio ROS Normal cardiovascular exam     Neuro/Psych negative neurological ROS  negative psych ROS   GI/Hepatic negative GI ROS, Neg liver ROS,   Endo/Other  Hypothyroidism   Renal/GU negative Renal ROS  negative genitourinary   Musculoskeletal negative musculoskeletal ROS (+)   Abdominal   Peds  Hematology negative hematology ROS (+)   Anesthesia Other Findings pre retinal fibrosis right eye  Reproductive/Obstetrics negative OB ROS                            Anesthesia Physical Anesthesia Plan  ASA: II  Anesthesia Plan: General   Post-op Pain Management:    Induction: Intravenous  PONV Risk Score and Plan: 3 and Treatment may vary due to age or medical condition, Ondansetron, Dexamethasone and Midazolam  Airway Management Planned: Oral ETT  Additional Equipment: None  Intra-op Plan:   Post-operative Plan: Extubation in OR  Informed Consent: I have reviewed the patients History and Physical, chart, labs and discussed the procedure including the risks, benefits and alternatives for the proposed anesthesia with the patient or authorized representative who has indicated his/her understanding and acceptance.     Dental advisory given  Plan Discussed with: CRNA  Anesthesia Plan Comments:        Anesthesia Quick Evaluation

## 2019-07-17 NOTE — Progress Notes (Signed)
Mariah Murray denies chest pain or shortness of breath. Patient tested negative for Covid_5/21_ and has been in quarantine since that time.

## 2019-07-18 ENCOUNTER — Encounter (HOSPITAL_COMMUNITY): Payer: Self-pay | Admitting: Ophthalmology

## 2019-07-18 ENCOUNTER — Encounter (HOSPITAL_COMMUNITY)
Admission: RE | Disposition: A | Discharge status: Home / Self Care | Payer: Self-pay | Source: Home / Self Care | Attending: Ophthalmology

## 2019-07-18 ENCOUNTER — Ambulatory Visit (HOSPITAL_COMMUNITY): Payer: BC Managed Care – PPO | Admitting: Anesthesiology

## 2019-07-18 ENCOUNTER — Ambulatory Visit (HOSPITAL_COMMUNITY)
Admission: RE | Admit: 2019-07-18 | Discharge: 2019-07-19 | Disposition: A | Discharge status: Home / Self Care | Payer: BC Managed Care – PPO | Attending: Ophthalmology | Admitting: Ophthalmology

## 2019-07-18 DIAGNOSIS — H35371 Puckering of macula, right eye: Secondary | ICD-10-CM | POA: Diagnosis present

## 2019-07-18 HISTORY — PX: 25 GAUGE PARS PLANA VITRECTOMY WITH 20 GAUGE MVR PORT FOR MACULAR HOLE: SHX6096

## 2019-07-18 HISTORY — DX: Personal history of other medical treatment: Z92.89

## 2019-07-18 LAB — AUTOLOGOUS SERUM PATCH PREP

## 2019-07-18 LAB — CBC
HCT: 43.3 % (ref 36.0–46.0)
Hemoglobin: 13.4 g/dL (ref 12.0–15.0)
MCH: 28.3 pg (ref 26.0–34.0)
MCHC: 30.9 g/dL (ref 30.0–36.0)
MCV: 91.4 fL (ref 80.0–100.0)
Platelets: 247 10*3/uL (ref 150–400)
RBC: 4.74 MIL/uL (ref 3.87–5.11)
RDW: 12.7 % (ref 11.5–15.5)
WBC: 6.6 10*3/uL (ref 4.0–10.5)
nRBC: 0 % (ref 0.0–0.2)

## 2019-07-18 SURGERY — 25 GAUGE PARS PLANA VITRECTOMY WITH 20 GAUGE MVR PORT FOR MACULAR HOLE
Anesthesia: General | Site: Eye | Laterality: Right

## 2019-07-18 MED ORDER — PREDNISOLONE ACETATE 1 % OP SUSP
1.0000 [drp] | Freq: Four times a day (QID) | OPHTHALMIC | Status: DC
  Filled 2019-07-18: qty 5

## 2019-07-18 MED ORDER — ACETAZOLAMIDE SODIUM 500 MG IJ SOLR
INTRAMUSCULAR | Status: AC
  Filled 2019-07-18: qty 500

## 2019-07-18 MED ORDER — PROPOFOL 10 MG/ML IV BOLUS
INTRAVENOUS | Status: DC | PRN
  Administered 2019-07-18: 120 mg via INTRAVENOUS
  Administered 2019-07-18: 20 mg via INTRAVENOUS

## 2019-07-18 MED ORDER — ORAL CARE MOUTH RINSE: 15.0000 mL | Freq: Once | OROMUCOSAL | Status: AC

## 2019-07-18 MED ORDER — ATORVASTATIN CALCIUM 10 MG PO TABS
10.0000 mg | ORAL_TABLET | Freq: Every day | ORAL | Status: DC
  Administered 2019-07-18: 10 mg via ORAL
  Filled 2019-07-18: qty 1

## 2019-07-18 MED ORDER — ACETAMINOPHEN 500 MG PO TABS
1000.0000 mg | ORAL_TABLET | Freq: Once | ORAL | Status: AC
  Administered 2019-07-18: 1000 mg via ORAL
  Filled 2019-07-18: qty 2

## 2019-07-18 MED ORDER — ONDANSETRON HCL 4 MG/2ML IJ SOLN
INTRAMUSCULAR | Status: DC | PRN
  Administered 2019-07-18: 4 mg via INTRAVENOUS

## 2019-07-18 MED ORDER — PHENYLEPHRINE HCL 2.5 % OP SOLN
1.0000 [drp] | OPHTHALMIC | Status: AC | PRN
  Administered 2019-07-18 (×3): 1 [drp] via OPHTHALMIC
  Filled 2019-07-18: qty 2

## 2019-07-18 MED ORDER — BACITRACIN-POLYMYXIN B 500-10000 UNIT/GM OP OINT
1.0000 "application " | TOPICAL_OINTMENT | Freq: Three times a day (TID) | OPHTHALMIC | Status: DC
  Filled 2019-07-18: qty 3.5

## 2019-07-18 MED ORDER — EPINEPHRINE PF 1 MG/ML IJ SOLN
INTRAMUSCULAR | Status: AC
  Filled 2019-07-18: qty 1

## 2019-07-18 MED ORDER — BACITRACIN-POLYMYXIN B 500-10000 UNIT/GM OP OINT
TOPICAL_OINTMENT | OPHTHALMIC | Status: AC
  Filled 2019-07-18: qty 3.5

## 2019-07-18 MED ORDER — ATROPINE SULFATE 1 % OP SOLN
OPHTHALMIC | Status: DC | PRN
  Administered 2019-07-18: 1 [drp] via OPHTHALMIC

## 2019-07-18 MED ORDER — CEFAZOLIN SODIUM-DEXTROSE 2-4 GM/100ML-% IV SOLN
2.0000 g | INTRAVENOUS | Status: AC
  Administered 2019-07-18: 2 g via INTRAVENOUS
  Filled 2019-07-18: qty 100

## 2019-07-18 MED ORDER — ATROPINE SULFATE 1 % OP SOLN
OPHTHALMIC | Status: AC
  Filled 2019-07-18: qty 5

## 2019-07-18 MED ORDER — ONDANSETRON HCL 4 MG/2ML IJ SOLN
INTRAMUSCULAR | Status: AC
  Filled 2019-07-18: qty 2

## 2019-07-18 MED ORDER — BSS IO SOLN
INTRAOCULAR | Status: AC
  Filled 2019-07-18: qty 15

## 2019-07-18 MED ORDER — ROCURONIUM BROMIDE 10 MG/ML (PF) SYRINGE
PREFILLED_SYRINGE | INTRAVENOUS | Status: AC
  Filled 2019-07-18: qty 10

## 2019-07-18 MED ORDER — CEFTAZIDIME 1 G IJ SOLR
INTRAMUSCULAR | Status: AC
  Filled 2019-07-18: qty 1

## 2019-07-18 MED ORDER — CYCLOPENTOLATE HCL 1 % OP SOLN
1.0000 [drp] | OPHTHALMIC | Status: AC | PRN
  Administered 2019-07-18 (×3): 1 [drp] via OPHTHALMIC
  Filled 2019-07-18: qty 2

## 2019-07-18 MED ORDER — ACETAMINOPHEN 325 MG PO TABS
325.0000 mg | ORAL_TABLET | ORAL | Status: DC | PRN
  Administered 2019-07-18 (×2): 650 mg via ORAL
  Filled 2019-07-18 (×2): qty 2

## 2019-07-18 MED ORDER — TROPICAMIDE 1 % OP SOLN
1.0000 [drp] | OPHTHALMIC | Status: AC | PRN
  Administered 2019-07-18 (×3): 1 [drp] via OPHTHALMIC
  Filled 2019-07-18: qty 15

## 2019-07-18 MED ORDER — PROMETHAZINE HCL 25 MG/ML IJ SOLN: 6.2500 mg | INTRAMUSCULAR | Status: DC | PRN

## 2019-07-18 MED ORDER — LEVOTHYROXINE SODIUM 100 MCG PO TABS
100.0000 ug | ORAL_TABLET | Freq: Every day | ORAL | Status: DC
  Administered 2019-07-19: 100 ug via ORAL
  Filled 2019-07-18: qty 1

## 2019-07-18 MED ORDER — BACITRACIN-POLYMYXIN B 500-10000 UNIT/GM OP OINT
TOPICAL_OINTMENT | OPHTHALMIC | Status: DC | PRN
  Administered 2019-07-18: 1 via OPHTHALMIC

## 2019-07-18 MED ORDER — SODIUM HYALURONATE 10 MG/ML IO SOLN
INTRAOCULAR | Status: AC
  Filled 2019-07-18: qty 0.85

## 2019-07-18 MED ORDER — SODIUM CHLORIDE (PF) 0.9 % IJ SOLN
INTRAMUSCULAR | Status: AC
  Filled 2019-07-18: qty 10

## 2019-07-18 MED ORDER — BSS PLUS IO SOLN
INTRAOCULAR | Status: AC
  Filled 2019-07-18: qty 500

## 2019-07-18 MED ORDER — PROVISC 10 MG/ML IO SOLN
INTRAOCULAR | Status: DC | PRN
  Administered 2019-07-18: .85 mL via INTRAOCULAR

## 2019-07-18 MED ORDER — GATIFLOXACIN 0.5 % OP SOLN
1.0000 [drp] | Freq: Four times a day (QID) | OPHTHALMIC | Status: DC
  Filled 2019-07-18: qty 2.5

## 2019-07-18 MED ORDER — SUGAMMADEX SODIUM 200 MG/2ML IV SOLN
INTRAVENOUS | Status: DC | PRN
  Administered 2019-07-18: 400 mg via INTRAVENOUS

## 2019-07-18 MED ORDER — DEXAMETHASONE SODIUM PHOSPHATE 10 MG/ML IJ SOLN
INTRAMUSCULAR | Status: DC | PRN
Start: 2019-07-18 — End: 2019-07-18
  Administered 2019-07-18: 10 mg via INTRAVENOUS

## 2019-07-18 MED ORDER — MORPHINE SULFATE (PF) 2 MG/ML IV SOLN: 1.0000 mg | INTRAVENOUS | Status: DC | PRN

## 2019-07-18 MED ORDER — LIDOCAINE 2% (20 MG/ML) 5 ML SYRINGE
INTRAMUSCULAR | Status: AC
  Filled 2019-07-18: qty 5

## 2019-07-18 MED ORDER — TETRACAINE HCL 0.5 % OP SOLN
2.0000 [drp] | Freq: Once | OPHTHALMIC | Status: DC
  Filled 2019-07-18: qty 4

## 2019-07-18 MED ORDER — DEXAMETHASONE SODIUM PHOSPHATE 10 MG/ML IJ SOLN
INTRAMUSCULAR | Status: DC | PRN
  Administered 2019-07-18: 10 mg

## 2019-07-18 MED ORDER — DORZOLAMIDE HCL-TIMOLOL MAL 2-0.5 % OP SOLN
1.0000 [drp] | Freq: Once | OPHTHALMIC | Status: AC
  Administered 2019-07-18: 1 [drp] via OPHTHALMIC
  Filled 2019-07-18 (×4): qty 10

## 2019-07-18 MED ORDER — FENTANYL CITRATE (PF) 250 MCG/5ML IJ SOLN
INTRAMUSCULAR | Status: AC
  Filled 2019-07-18: qty 5

## 2019-07-18 MED ORDER — MIDAZOLAM HCL 2 MG/2ML IJ SOLN
INTRAMUSCULAR | Status: AC
  Filled 2019-07-18: qty 2

## 2019-07-18 MED ORDER — LIDOCAINE 2% (20 MG/ML) 5 ML SYRINGE
INTRAMUSCULAR | Status: DC | PRN
  Administered 2019-07-18: 80 mg via INTRAVENOUS

## 2019-07-18 MED ORDER — POLYMYXIN B SULFATE 500000 UNITS IJ SOLR
INTRAMUSCULAR | Status: AC
  Filled 2019-07-18: qty 500000

## 2019-07-18 MED ORDER — OXYCODONE HCL 5 MG/5ML PO SOLN: 5.0000 mg | Freq: Once | ORAL | Status: DC | PRN

## 2019-07-18 MED ORDER — OXYCODONE HCL 5 MG PO TABS: 5.0000 mg | ORAL_TABLET | Freq: Once | ORAL | Status: DC | PRN

## 2019-07-18 MED ORDER — EPINEPHRINE PF 1 MG/ML IJ SOLN
INTRAMUSCULAR | Status: DC | PRN
  Administered 2019-07-18: .3 mL

## 2019-07-18 MED ORDER — SODIUM CHLORIDE 0.9 % IV SOLN: INTRAVENOUS | Status: DC

## 2019-07-18 MED ORDER — STERILE WATER FOR INJECTION IJ SOLN
INTRAMUSCULAR | Status: AC
  Filled 2019-07-18: qty 20

## 2019-07-18 MED ORDER — MAGNESIUM HYDROXIDE 400 MG/5ML PO SUSP: 15.0000 mL | Freq: Four times a day (QID) | ORAL | Status: DC | PRN

## 2019-07-18 MED ORDER — HYALURONIDASE HUMAN 150 UNIT/ML IJ SOLN
INTRAMUSCULAR | Status: AC
  Filled 2019-07-18: qty 1

## 2019-07-18 MED ORDER — BUPIVACAINE HCL (PF) 0.75 % IJ SOLN
INTRAMUSCULAR | Status: DC | PRN
  Administered 2019-07-18: 10 mL

## 2019-07-18 MED ORDER — TEMAZEPAM 15 MG PO CAPS: 15.0000 mg | ORAL_CAPSULE | Freq: Every evening | ORAL | Status: DC | PRN

## 2019-07-18 MED ORDER — LIDOCAINE HCL 2 % IJ SOLN
INTRAMUSCULAR | Status: AC
  Filled 2019-07-18: qty 20

## 2019-07-18 MED ORDER — TRIAMCINOLONE ACETONIDE 40 MG/ML IJ SUSP
INTRAMUSCULAR | Status: AC
  Filled 2019-07-18: qty 5

## 2019-07-18 MED ORDER — DORZOLAMIDE HCL 2 % OP SOLN
1.0000 [drp] | Freq: Three times a day (TID) | OPHTHALMIC | Status: DC
  Filled 2019-07-18: qty 10

## 2019-07-18 MED ORDER — PHENYLEPHRINE HCL-NACL 10-0.9 MG/250ML-% IV SOLN
INTRAVENOUS | Status: DC | PRN
  Administered 2019-07-18: 50 ug/min via INTRAVENOUS

## 2019-07-18 MED ORDER — ACETAZOLAMIDE SODIUM 500 MG IJ SOLR
500.0000 mg | Freq: Once | INTRAMUSCULAR | Status: AC
  Administered 2019-07-19: 500 mg via INTRAVENOUS
  Filled 2019-07-18: qty 500

## 2019-07-18 MED ORDER — HYDROCODONE-ACETAMINOPHEN 5-325 MG PO TABS
1.0000 | ORAL_TABLET | ORAL | Status: DC | PRN
  Filled 2019-07-18: qty 1

## 2019-07-18 MED ORDER — ROCURONIUM BROMIDE 10 MG/ML (PF) SYRINGE
PREFILLED_SYRINGE | INTRAVENOUS | Status: DC | PRN
  Administered 2019-07-18: 40 mg via INTRAVENOUS
  Administered 2019-07-18: 20 mg via INTRAVENOUS

## 2019-07-18 MED ORDER — SODIUM CHLORIDE 0.45 % IV SOLN: INTRAVENOUS | Status: DC

## 2019-07-18 MED ORDER — LORATADINE 10 MG PO TABS
10.0000 mg | ORAL_TABLET | Freq: Every day | ORAL | Status: DC
  Administered 2019-07-18: 10 mg via ORAL
  Filled 2019-07-18: qty 1

## 2019-07-18 MED ORDER — HYPROMELLOSE (GONIOSCOPIC) 2.5 % OP SOLN
OPHTHALMIC | Status: AC
  Filled 2019-07-18: qty 15

## 2019-07-18 MED ORDER — STERILE WATER FOR INJECTION IJ SOLN
INTRAMUSCULAR | Status: DC | PRN
  Administered 2019-07-18: 20 mL

## 2019-07-18 MED ORDER — DEXAMETHASONE SODIUM PHOSPHATE 10 MG/ML IJ SOLN
INTRAMUSCULAR | Status: AC
  Filled 2019-07-18: qty 1

## 2019-07-18 MED ORDER — ONDANSETRON HCL 4 MG/2ML IJ SOLN: 4.0000 mg | Freq: Four times a day (QID) | INTRAMUSCULAR | Status: DC | PRN

## 2019-07-18 MED ORDER — ASCORBIC ACID 500 MG PO TABS
1000.0000 mg | ORAL_TABLET | Freq: Every day | ORAL | Status: DC
  Administered 2019-07-18: 1000 mg via ORAL
  Filled 2019-07-18: qty 2

## 2019-07-18 MED ORDER — FENTANYL CITRATE (PF) 100 MCG/2ML IJ SOLN: 25.0000 ug | INTRAMUSCULAR | Status: DC | PRN

## 2019-07-18 MED ORDER — BUPIVACAINE HCL (PF) 0.75 % IJ SOLN
INTRAMUSCULAR | Status: AC
  Filled 2019-07-18: qty 10

## 2019-07-18 MED ORDER — BRIMONIDINE TARTRATE 0.2 % OP SOLN
1.0000 [drp] | Freq: Two times a day (BID) | OPHTHALMIC | Status: DC
  Filled 2019-07-18: qty 5

## 2019-07-18 MED ORDER — BSS PLUS IO SOLN
INTRAOCULAR | Status: DC | PRN
  Administered 2019-07-18: 1 via INTRAOCULAR

## 2019-07-18 MED ORDER — LATANOPROST 0.005 % OP SOLN
1.0000 [drp] | Freq: Every day | OPHTHALMIC | Status: DC
  Filled 2019-07-18: qty 2.5

## 2019-07-18 MED ORDER — FENTANYL CITRATE (PF) 100 MCG/2ML IJ SOLN
INTRAMUSCULAR | Status: DC | PRN
  Administered 2019-07-18: 100 ug via INTRAVENOUS

## 2019-07-18 MED ORDER — PHENYLEPHRINE HCL (PRESSORS) 10 MG/ML IV SOLN
INTRAVENOUS | Status: AC
  Filled 2019-07-18: qty 1

## 2019-07-18 MED ORDER — CHLORHEXIDINE GLUCONATE 0.12 % MT SOLN
15.0000 mL | Freq: Once | OROMUCOSAL | Status: AC
  Administered 2019-07-18: 15 mL via OROMUCOSAL
  Filled 2019-07-18: qty 15

## 2019-07-18 MED ORDER — GATIFLOXACIN 0.5 % OP SOLN
1.0000 [drp] | OPHTHALMIC | Status: AC | PRN
  Administered 2019-07-18 (×3): 1 [drp] via OPHTHALMIC
  Filled 2019-07-18: qty 2.5

## 2019-07-18 MED ORDER — PROPOFOL 10 MG/ML IV BOLUS
INTRAVENOUS | Status: AC
  Filled 2019-07-18: qty 20

## 2019-07-18 SURGICAL SUPPLY — 74 items
BAND WRIST GAS GREEN (MISCELLANEOUS) IMPLANT
BLADE EYE CATARACT 19 1.4 BEAV (BLADE) IMPLANT
BLADE MVR KNIFE 19G (BLADE) IMPLANT
BLADE MVR KNIFE 20G (BLADE) ×3 IMPLANT
BNDG EYE OVAL (GAUZE/BANDAGES/DRESSINGS) ×2 IMPLANT
CANNULA VLV SOFT TIP 25G (OPHTHALMIC) ×1 IMPLANT
CANNULA VLV SOFT TIP 25GA (OPHTHALMIC) ×3 IMPLANT
CORD BIPOLAR FORCEPS 12FT (ELECTRODE) ×3 IMPLANT
COTTONBALL LRG STERILE PKG (GAUZE/BANDAGES/DRESSINGS) ×9 IMPLANT
COVER MAYO STAND STRL (DRAPES) IMPLANT
COVER WAND RF STERILE (DRAPES) ×3 IMPLANT
DRAPE INCISE 51X51 W/FILM STRL (DRAPES) IMPLANT
DRAPE OPHTHALMIC 77X100 STRL (CUSTOM PROCEDURE TRAY) ×3 IMPLANT
ERASER HMR WETFIELD 23G BP (MISCELLANEOUS) ×3 IMPLANT
FILTER BLUE MILLIPORE (MISCELLANEOUS) IMPLANT
FILTER STRAW FLUID ASPIR (MISCELLANEOUS) ×3 IMPLANT
FORCEPS GRIESHABER ILM 25G A (INSTRUMENTS) ×2 IMPLANT
GAS AUTO FILL CONSTEL (OPHTHALMIC) ×3
GAS AUTO FILL CONSTELLATION (OPHTHALMIC) ×1 IMPLANT
GAS WRIST BAND GREEN (MISCELLANEOUS)
GLOVE SS BIOGEL STRL SZ 6.5 (GLOVE) ×1 IMPLANT
GLOVE SS BIOGEL STRL SZ 7 (GLOVE) ×1 IMPLANT
GLOVE SUPERSENSE BIOGEL SZ 6.5 (GLOVE) ×2
GLOVE SUPERSENSE BIOGEL SZ 7 (GLOVE) ×2
GLOVE SURG 8.5 LATEX PF (GLOVE) ×3 IMPLANT
GLOVE TRIUMPH SURG SIZE 8.5 (KITS) ×3 IMPLANT
GOWN STRL REUS W/ TWL LRG LVL3 (GOWN DISPOSABLE) ×3 IMPLANT
GOWN STRL REUS W/TWL LRG LVL3 (GOWN DISPOSABLE) ×6
HANDLE PNEUMATIC FOR CONSTEL (OPHTHALMIC) ×2 IMPLANT
KIT BASIN OR (CUSTOM PROCEDURE TRAY) ×3 IMPLANT
KNIFE GRIESHABER SHARP 2.5MM (MISCELLANEOUS) IMPLANT
LOOP FINESSE 25 GA (MISCELLANEOUS) ×2 IMPLANT
MICROPICK 25G (MISCELLANEOUS) ×3
NDL 18GX1X1/2 (RX/OR ONLY) (NEEDLE) ×1 IMPLANT
NDL 25GX 5/8IN NON SAFETY (NEEDLE) ×1 IMPLANT
NDL FILTER BLUNT 18X1 1/2 (NEEDLE) IMPLANT
NDL HYPO 30X.5 LL (NEEDLE) IMPLANT
NEEDLE 18GX1X1/2 (RX/OR ONLY) (NEEDLE) ×3 IMPLANT
NEEDLE 25GX 5/8IN NON SAFETY (NEEDLE) ×3 IMPLANT
NEEDLE FILTER BLUNT 18X 1/2SAF (NEEDLE)
NEEDLE FILTER BLUNT 18X1 1/2 (NEEDLE) IMPLANT
NEEDLE HYPO 30X.5 LL (NEEDLE) IMPLANT
NS IRRIG 1000ML POUR BTL (IV SOLUTION) ×3 IMPLANT
PACK FRAGMATOME (OPHTHALMIC) IMPLANT
PACK VITRECTOMY CUSTOM (CUSTOM PROCEDURE TRAY) ×3 IMPLANT
PAD ARMBOARD 7.5X6 YLW CONV (MISCELLANEOUS) ×6 IMPLANT
PAK PIK VITRECTOMY CVS 25GA (OPHTHALMIC) ×3 IMPLANT
PIC ILLUMINATED 25G (OPHTHALMIC) ×3
PICK MICROPICK 25G (MISCELLANEOUS) IMPLANT
PIK ILLUMINATED 25G (OPHTHALMIC) ×1 IMPLANT
PROBE LASER ILLUM FLEX CVD 25G (OPHTHALMIC) IMPLANT
REPL STRA BRUSH NDL (NEEDLE) ×1 IMPLANT
REPL STRA BRUSH NEEDLE (NEEDLE) ×3 IMPLANT
RESERVOIR BACK FLUSH (MISCELLANEOUS) ×3 IMPLANT
ROLLS DENTAL (MISCELLANEOUS) ×6 IMPLANT
SCRAPER DIAMOND 25GA (OPHTHALMIC RELATED) IMPLANT
SCRAPER DIAMOND DUST MEMBRANE (MISCELLANEOUS) ×3 IMPLANT
SHIELD EYE LENSE ONLY DISP (GAUZE/BANDAGES/DRESSINGS) ×2 IMPLANT
SPONGE SURGIFOAM ABS GEL 12-7 (HEMOSTASIS) ×3 IMPLANT
STOPCOCK 4 WAY LG BORE MALE ST (IV SETS) IMPLANT
SUT CHROMIC 7 0 TG140 8 (SUTURE) IMPLANT
SUT ETHILON 9 0 TG140 8 (SUTURE) ×3 IMPLANT
SUT POLY NON ABSORB 10-0 8 STR (SUTURE) IMPLANT
SUT SILK 4 0 RB 1 (SUTURE) IMPLANT
SYR 10ML LL (SYRINGE) IMPLANT
SYR 20ML LL LF (SYRINGE) ×3 IMPLANT
SYR 5ML LL (SYRINGE) IMPLANT
SYR BULB EAR ULCER 3OZ GRN STR (SYRINGE) ×3 IMPLANT
SYR TB 1ML LUER SLIP (SYRINGE) ×3 IMPLANT
TAPE SURG TRANSPORE 1 IN (GAUZE/BANDAGES/DRESSINGS) IMPLANT
TAPE SURGICAL TRANSPORE 1 IN (GAUZE/BANDAGES/DRESSINGS) ×2
TUBING HIGH PRESS EXTEN 6IN (TUBING) IMPLANT
WATER STERILE IRR 1000ML POUR (IV SOLUTION) ×3 IMPLANT
WIPE INSTRUMENT VISIWIPE 73X73 (MISCELLANEOUS) IMPLANT

## 2019-07-18 NOTE — Transfer of Care (Signed)
Immediate Anesthesia Transfer of Care Note  Patient: Mariah Murray  Procedure(s) Performed: 25 GAUGE PARS PLANA VITRECTOMY WITH 20 GAUGE MVR PORT FOR Preretinal fibrosis, headscope laser photocoagulation, and air-fluid exchange (Right Eye)  Patient Location: PACU  Anesthesia Type:General  Level of Consciousness: awake, alert  and oriented  Airway & Oxygen Therapy: Patient Spontanous Breathing  Post-op Assessment: Report given to RN and Post -op Vital signs reviewed and stable  Post vital signs: Reviewed and stable  Last Vitals:  Vitals Value Taken Time  BP 126/75 07/18/19 1340  Temp    Pulse 87 07/18/19 1341  Resp 15 07/18/19 1341  SpO2 95 % 07/18/19 1341  Vitals shown include unvalidated device data.  Last Pain:  Vitals:   07/18/19 0925  TempSrc:   PainSc: 0-No pain      Patients Stated Pain Goal: 3 ([id number])  Complications: No apparent anesthesia complications

## 2019-07-18 NOTE — H&P (Signed)
I examined the patient today and there is no change in the medical status 

## 2019-07-18 NOTE — Brief Op Note (Signed)
Brief Operative note   Preoperative diagnosis:  pre retinal fibrosis right eye Postoperative diagnosis  * No Diagnosis Codes entered *  Procedures: Pars plana vitrecotmy, membrane peel, laser, gas injection right eye  Surgeon:  Hayden Pedro, MD...  Assistant:  Deatra Ina SA    Anesthesia: General  Specimen: none  Estimated blood loss:  1cc  Complications: none  Patient sent to PACU in good condition  Composed by Hayden Pedro MD  Dictation number: [id number]

## 2019-07-18 NOTE — Anesthesia Procedure Notes (Signed)

## 2019-07-18 NOTE — Progress Notes (Signed)
Mariah Murray arrived to  room 6N 20 via wheelchair from PACU. Report received. Patient made comfort call bell at reach.

## 2019-07-18 NOTE — Anesthesia Postprocedure Evaluation (Signed)
Anesthesia Post Note  Patient: Joanna Neuhaus  Procedure(s) Performed: 25 GAUGE PARS PLANA VITRECTOMY WITH 20 GAUGE MVR PORT FOR Preretinal fibrosis, headscope laser photocoagulation, and air-fluid exchange (Right Eye)     Patient location during evaluation: PACU Anesthesia Type: General Level of consciousness: awake and alert Pain management: pain level controlled Vital Signs Assessment: post-procedure vital signs reviewed and stable Respiratory status: spontaneous breathing, nonlabored ventilation and respiratory function stable Cardiovascular status: blood pressure returned to baseline and stable Postop Assessment: no apparent nausea or vomiting Anesthetic complications: no    Last Vitals:  Vitals:   07/18/19 1345 07/18/19 1355  BP:  107/68  Pulse: 77 69  Resp: 12 11  Temp:    SpO2: 95% 99%    Last Pain:  Vitals:   07/18/19 1340  TempSrc:   PainSc: 0-No pain                 Saina Waage,W. EDMOND

## 2019-07-19 DIAGNOSIS — H35371 Puckering of macula, right eye: Secondary | ICD-10-CM | POA: Diagnosis not present

## 2019-07-19 MED ORDER — BACITRACIN-POLYMYXIN B 500-10000 UNIT/GM OP OINT: 1.0000 "application " | TOPICAL_OINTMENT | Freq: Three times a day (TID) | OPHTHALMIC | 0 refills | Status: AC

## 2019-07-19 MED ORDER — GATIFLOXACIN 0.5 % OP SOLN: 1.0000 [drp] | Freq: Four times a day (QID) | OPHTHALMIC | Status: AC

## 2019-07-19 MED ORDER — PREDNISOLONE ACETATE 1 % OP SUSP: 1.0000 [drp] | Freq: Four times a day (QID) | OPHTHALMIC | 0 refills | Status: AC

## 2019-07-19 NOTE — Discharge Summary (Signed)
Discharge summary not needed on [facility] patients per medical records. 

## 2019-07-19 NOTE — Op Note (Signed)
NAMERAECHELLE, FEURTADO MEDICAL RECORD [id number] ACCOUNT [id number] DATE OF BIRTH:06/15/1955 FACILITY: [facility] LOCATION: [place], [place]  OPERATIVE REPORT  DATE OF PROCEDURE:  07/18/2019  ADMISSION DIAGNOSIS:  Preretinal fibrosis, right eye.  PROCEDURES:  Pars plana vitrectomy, retinal photocoagulation, membrane peel, gas fluid exchange in the right eye.  SURGEON:  Tempie Hoist, MD  ASSISTANT:  Deatra Ina, MD   ANESTHESIA:  General.  DESCRIPTION OF PROCEDURE:  Usual prep and drape.  25-gauge trocars placed at 8 o-clock and 10 o'clock.  Conjunctival peritomy at 2 o'clock.  A diathermy on the sclera and 3 layered MVR wound was created at 2 o'clock.  The MVR blade was used to incise  into the vitreous.  Infusion was at 8 o'clock.  The contact lens ring was anchored into place at 6 and 12 o'clock.  Provisc placed on the corneal surface and the flat contact lens was placed.  The indirect ophthalmoscope laser was moved into place.  446  burns were placed around the retinal periphery with a power of 400 milliwatts each, 1000 microns each, and 0.1 seconds each.  These laser marks were placed in weak areas of the retina in the far periphery.  The attention was then carried to the pars  plana area where pars plana vitrectomy was begun.  The lighted pick and the cutter were placed at 10 and 2 o'clock, respectively.  The midvitreous was entered and white membranes were encountered and carefully removed under low suction and rapid cutting.   The vitrectomy was carried posteriorly under view of the flat contact lens down to the macular surface.  The macula was thrown into a large elevated folds.  There were multiple ridges, white areas, and hemorrhages.  Approximately 30 minutes of membrane  peeling then occurred with the automated scissors, the lighted pick, the vitreous cutter, the silicone tipped suction line.  Multiple layers of fibrosis were elevated and removed with the  vitreous cutter and the vitreous forceps.  Once the macula was  found to be quite smooth the peeling was stopped between the posterior pole and the equator.  Some peripheral membranes were allowed to remain.  Minimal hemorrhage occurred in these areas.  No hemostasis with cautery was needed.  The vitrectomy was  carried into the far periphery with the BIOM viewing system.  All peripheral vitreous was removed.  A gas fluid exchange 40% was then performed.  The instruments were removed from the eye.  The sclerotomy was closed with 9-0 nylon.  The 25-gauge trocars  were removed from the eye.  The wounds were tested and found to be secure.  The conjunctiva was closed with wet field cautery.  Polymyxin and ceftazidime were rinsed around the globe for antibiotic coverage.  Decadron 10 mg was injected into the lower  subconjunctival space.  Polysporin ophthalmic ointment was placed.  Atropine solution was placed.  Marcaine was rinsed around the globe for postoperative pain.  Closing pressure was 10 with a Barraquer tonometer.  Polysporin ophthalmic ointment, a patch  and a shield were placed.  The patient was awakened and taken to recovery in satisfactory condition.  DURATION:  One hour 30 minutes.  COMPLICATIONS:  None.  CN/NUANCE  D:07/18/2019 T:07/19/2019 JOB:[id number]

## 2019-07-19 NOTE — Progress Notes (Signed)
07/19/2019, 6:31 AM  Mental Status:  Awake, Alert, Oriented  Anterior segment: Cornea  Clear    Anterior Chamber Clear    Lens:   Clear, IOL, Cataract  Intra Ocular Pressure 12 mmHg with Tonopen  Vitreous: Clear 15%gas bubble   Retina:  Attached Good laser reaction   Impression: Excellent result Retina attached  Final Diagnosis: Principal Problem:   Macular pucker, right Active Problems:   Preretinal fibrosis, right   Plan: start post operative eye drops.  Discharge to home.  Give post operative instructions  Hayden Pedro 07/19/2019, 6:31 AM

## 2019-07-25 ENCOUNTER — Other Ambulatory Visit: Payer: Self-pay

## 2019-07-25 ENCOUNTER — Encounter (INDEPENDENT_AMBULATORY_CARE_PROVIDER_SITE_OTHER): Payer: BC Managed Care – PPO | Admitting: Ophthalmology

## 2019-07-25 DIAGNOSIS — H35371 Puckering of macula, right eye: Secondary | ICD-10-CM | POA: Diagnosis not present

## 2019-08-15 ENCOUNTER — Encounter (INDEPENDENT_AMBULATORY_CARE_PROVIDER_SITE_OTHER): Payer: BC Managed Care – PPO | Admitting: Ophthalmology

## 2019-08-15 ENCOUNTER — Other Ambulatory Visit: Payer: Self-pay

## 2019-08-15 DIAGNOSIS — H35371 Puckering of macula, right eye: Secondary | ICD-10-CM

## 2019-10-24 ENCOUNTER — Encounter (INDEPENDENT_AMBULATORY_CARE_PROVIDER_SITE_OTHER): Payer: BC Managed Care – PPO | Admitting: Ophthalmology

## 2019-10-24 ENCOUNTER — Other Ambulatory Visit: Payer: Self-pay

## 2019-10-24 DIAGNOSIS — H353111 Nonexudative age-related macular degeneration, right eye, early dry stage: Secondary | ICD-10-CM | POA: Diagnosis not present

## 2019-10-24 DIAGNOSIS — H35371 Puckering of macula, right eye: Secondary | ICD-10-CM | POA: Diagnosis not present

## 2019-10-24 DIAGNOSIS — H2513 Age-related nuclear cataract, bilateral: Secondary | ICD-10-CM | POA: Diagnosis not present

## 2019-10-24 DIAGNOSIS — H43812 Vitreous degeneration, left eye: Secondary | ICD-10-CM

## 2020-01-23 ENCOUNTER — Encounter (INDEPENDENT_AMBULATORY_CARE_PROVIDER_SITE_OTHER): Payer: BC Managed Care – PPO | Admitting: Ophthalmology

## 2020-04-24 IMAGING — DX MM BREAST SURGICAL SPECIMEN
1 series · 2 of 2 positions shown · non-contrast
Comparison: Previous exam(s).

CLINICAL DATA: 62-year-old patient had a radioactive seed
localization of the left breast on 11/16/2018 prior to excision of a
papilloma.

EXAM:
SPECIMEN RADIOGRAPH OF THE LEFT BREAST

[Series 2: specimen digital x-ray, derived · left · 2 of 2 slices shown]
[im 1/2]
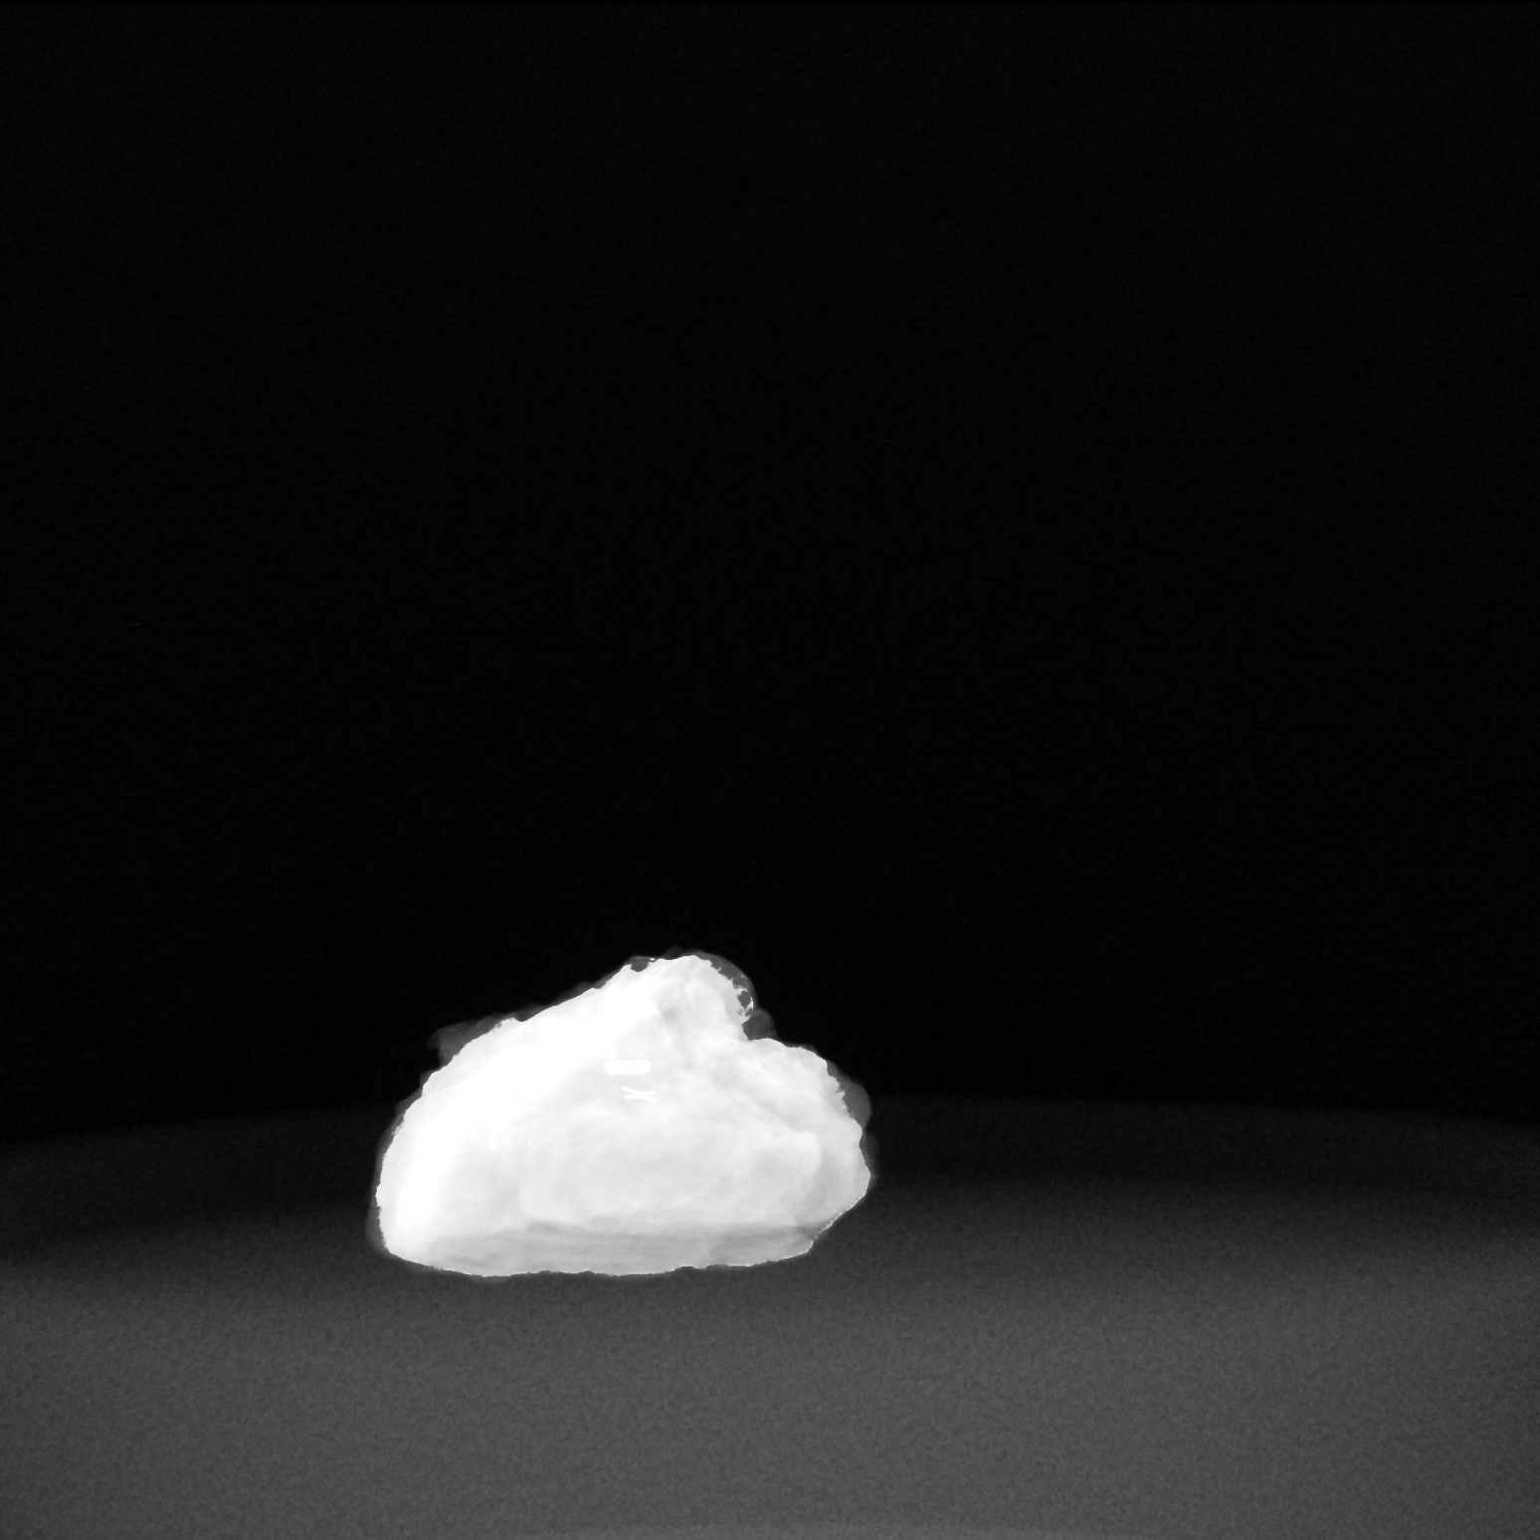
[im 2/2]
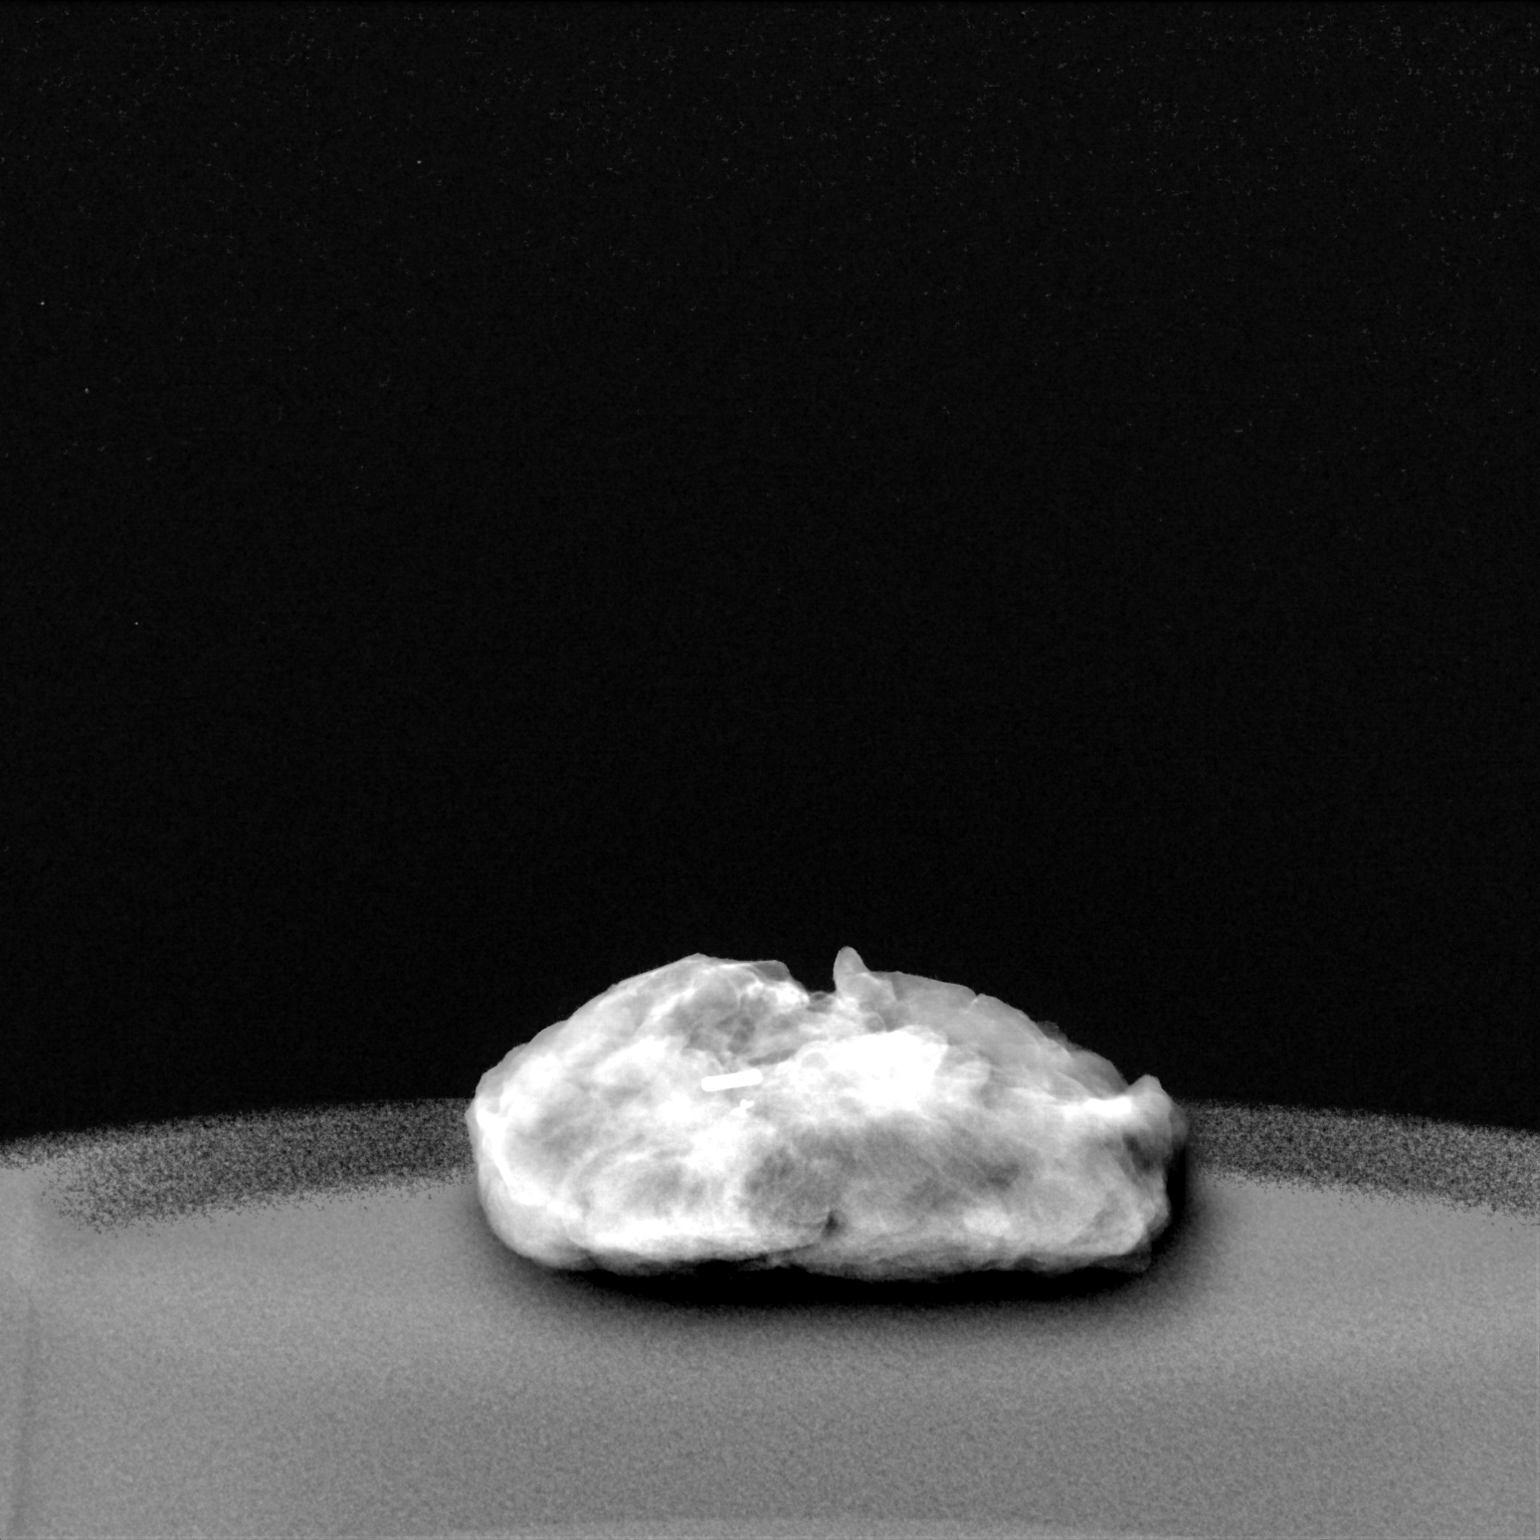

[2 of 2 positions shown; findings below may reference images not displayed]

FINDINGS: Status post excision of the left breast. The radioactive seed and
biopsy marker clip are present and completely intact.
IMPRESSION: Specimen radiograph of the left breast.
